# Patient Record
Sex: Female | Born: 2000 | Race: White | Hispanic: No | Marital: Single | Smoking: Never smoker
Health system: Southern US, Community
[De-identification: ages and names within clinical notes are randomized; demographics above are authoritative.]

---
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
## 2020-01-04 ENCOUNTER — Ambulatory Visit: Payer: BC Managed Care – PPO

## 2020-01-16 ENCOUNTER — Ambulatory Visit
Admission: RE | Admit: 2020-01-16 | Discharge: 2020-01-16 | Disposition: A | Discharge status: Home / Self Care | Payer: BC Managed Care – PPO | Source: Ambulatory Visit | Attending: Acute Care | Admitting: Acute Care

## 2020-01-16 ENCOUNTER — Other Ambulatory Visit: Payer: Self-pay

## 2020-01-16 DIAGNOSIS — G35 Multiple sclerosis: Secondary | ICD-10-CM | POA: Diagnosis not present

## 2020-01-16 MED ORDER — GADOBUTROL 1 MMOL/ML IV SOLN
4.0000 mL | Freq: Once | INTRAVENOUS | Status: AC | PRN
  Administered 2020-01-16: 12:00:00 4 mL via INTRAVENOUS

## 2021-01-09 ENCOUNTER — Ambulatory Visit: Payer: BC Managed Care – PPO | Admitting: Dermatology

## 2021-04-05 IMAGING — MR MR HEAD WO/W CM
15 series · 48 of 48 positions shown · IV contrast (gadavist)
Comparison: 08/25/2016

CLINICAL DATA: Headache for 3-4 years which are increasing in
frequency

EXAM:
MRI HEAD WITHOUT AND WITH CONTRAST
TECHNIQUE: Multiplanar, multiecho pulse sequences of the brain and surrounding
structures were obtained without and with intravenous contrast.
CONTRAST:  4mL GADAVIST GADOBUTROL 1 MMOL/ML IV SOLN

[Series 5: ax dwi_tracew · axial · 3.0mm · 0.60mm/px · z∈[-29,+123]mm · 3 of 48 slices shown]
[im 1/48]
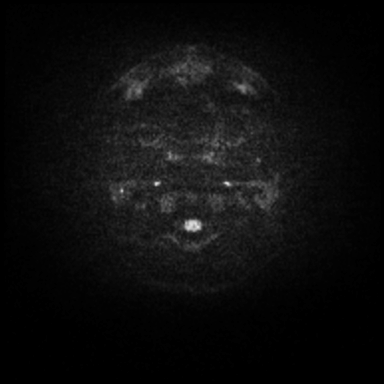
[im 24/48]
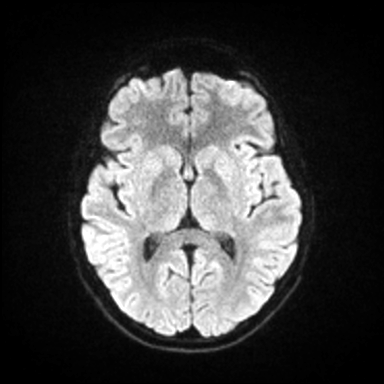
[im 48/48]
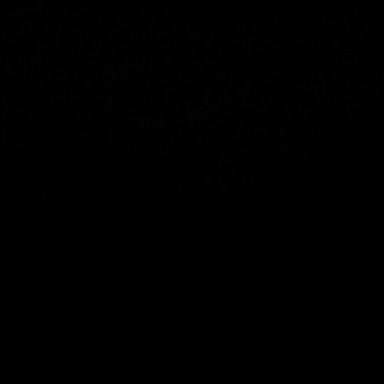

[Series 6: ax dwi_adc · axial · 3.0mm · 0.60mm/px · z∈[-29,+110]mm · 3 of 44 slices shown]
[im 1/44]
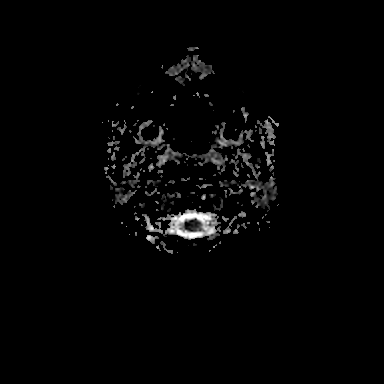
[im 22/44]
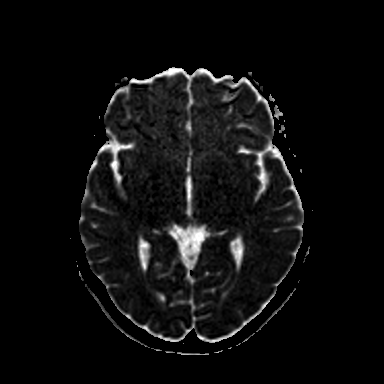
[im 44/44]
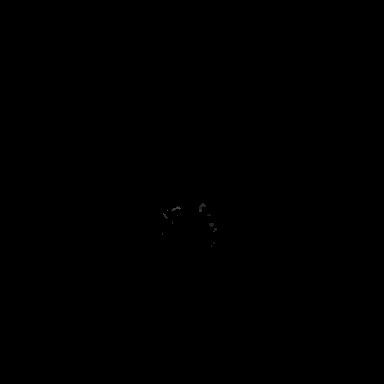

[Series 7: cor dwi_tracew · coronal · 5.0mm · 0.60mm/px · 2 of 38 slices shown]
[im 1/38]
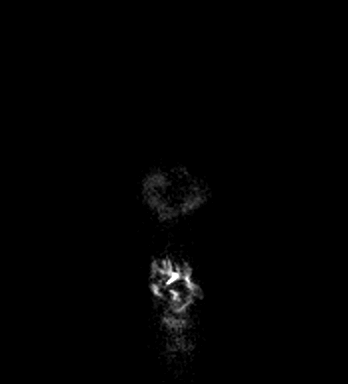
[im 38/38]
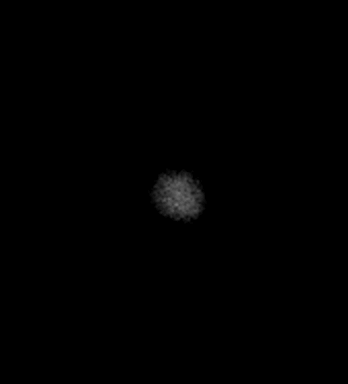

[Series 8: cor dwi_adc · coronal · 5.0mm · 0.60mm/px · 2 of 37 slices shown]
[im 1/37]
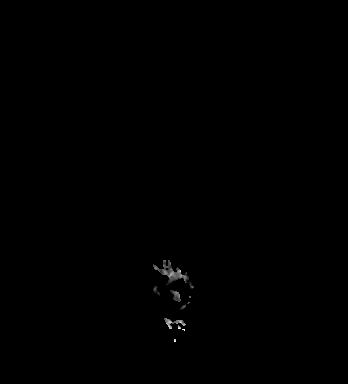
[im 37/37]
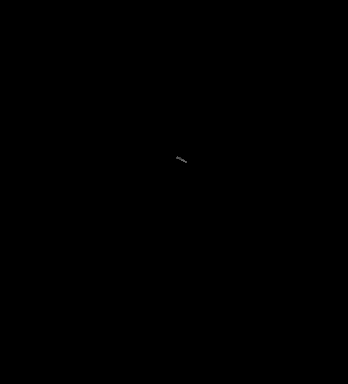

[Series 9: T1 · sagittal · 5.0mm · 0.60mm/px · 1 of 22 slices shown (1 of 2)]
[im 1/22]
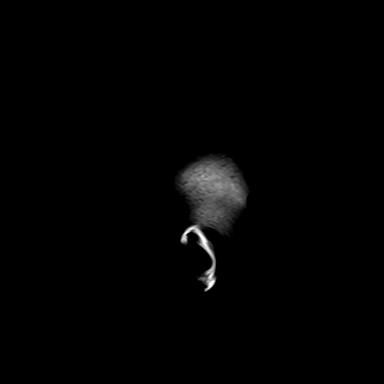

[Series 10: FLAIR · sagittal · 5.0mm · 0.94mm/px · 1 of 23 slices shown (1 of 2)]
[im 1/23]
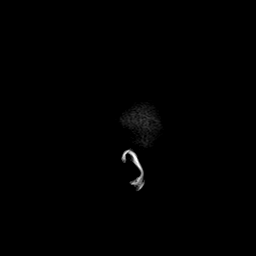

[Series 11: T2 · axial · 5.0mm · 0.55mm/px · 1 of 25 slices shown]
[im 1/25]
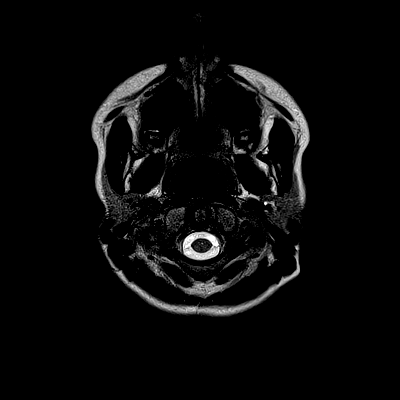

[Series 13: pha_images · axial · 3.0mm · 0.90mm/px · z∈[-38,+125]mm · 3 of 56 slices shown]
[im 1/56]
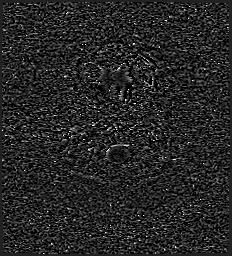
[im 28/56]
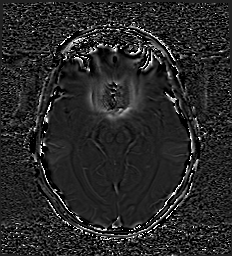
[im 56/56]
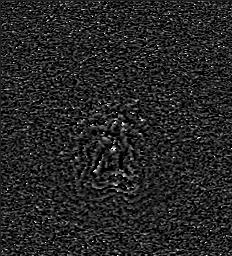

[Series 14: swi_images · axial · 3.0mm · 0.90mm/px · z∈[-38,+137]mm · 4 of 60 slices shown]
[im 1/60]
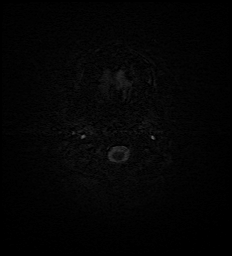
[im 20/60]
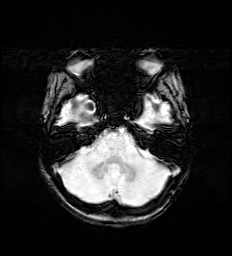
[im 40/60]
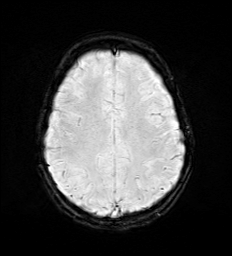
[im 60/60]
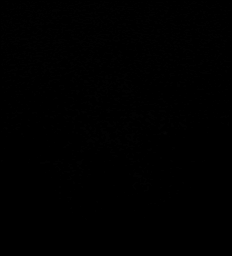

[Series 16: FLAIR · axial · 3.0mm · 0.53mm/px · z∈[-30,+130]mm · 3 of 55 slices shown (2 of 2)]
[im 1/55]
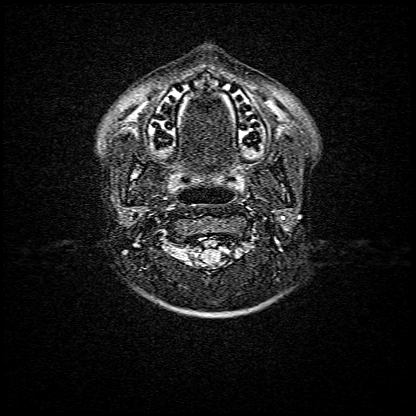
[im 28/55]
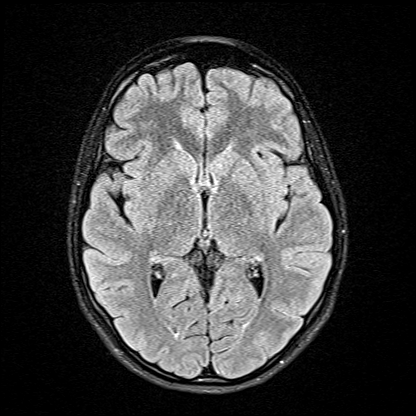
[im 55/55]
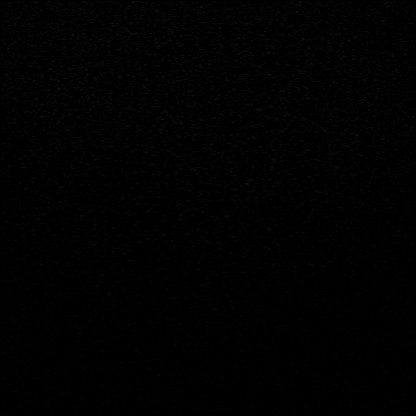

[Series 17: T1 · axial · 1.0mm · 0.98mm/px · z∈[-37,+130]mm · 10 of 162 slices shown (2 of 2)]
[im 1/162]
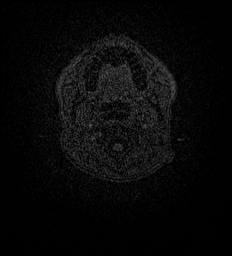
[im 18/162]
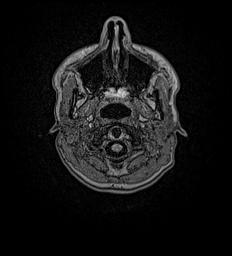
[im 36/162]
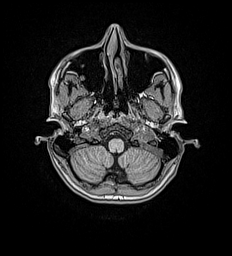
[im 54/162]
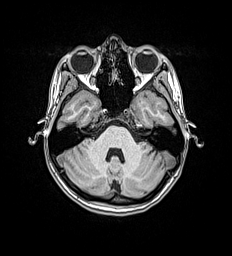
[im 72/162]
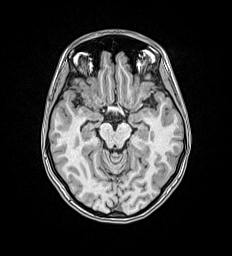
[im 90/162]
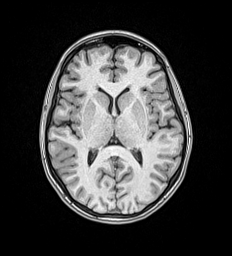
[im 108/162]
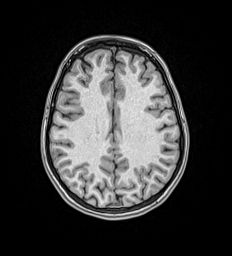
[im 126/162]
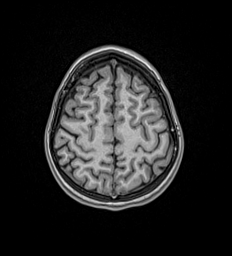
[im 144/162]
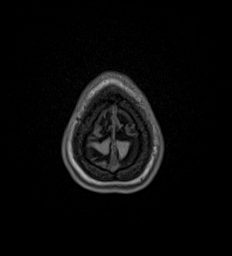
[im 162/162]
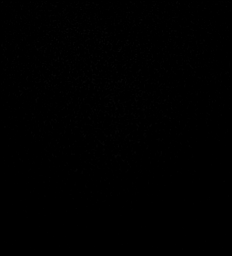

[Series 18: T2 post-contrast · coronal · 5.0mm · 0.57mm/px · 2 of 29 slices shown]
[im 1/29]
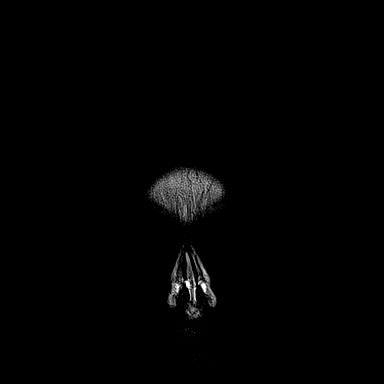
[im 29/29]
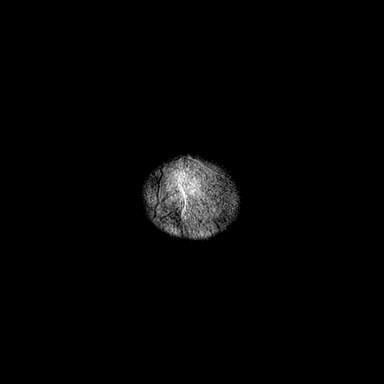

[Series 19: T1 post-contrast · axial · 1.0mm · 0.98mm/px · z∈[-37,+135]mm · 10 of 164 slices shown (1 of 3)]
[im 1/164]
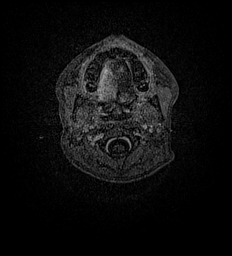
[im 19/164]
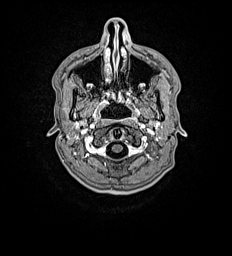
[im 37/164]
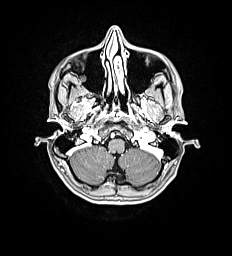
[im 55/164]
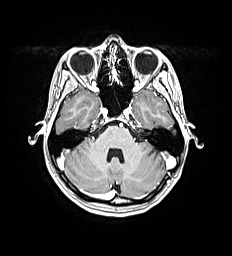
[im 73/164]
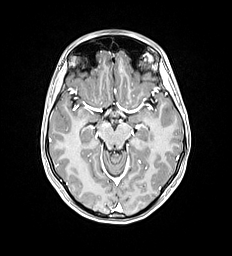
[im 91/164]
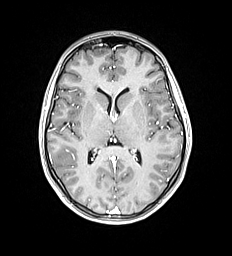
[im 109/164]
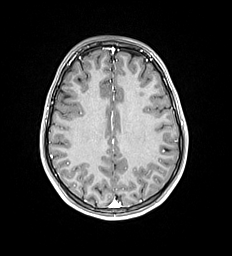
[im 127/164]
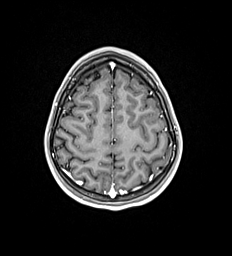
[im 145/164]
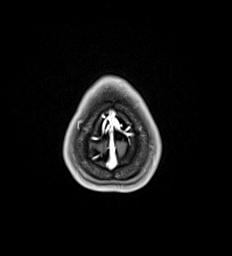
[im 164/164]
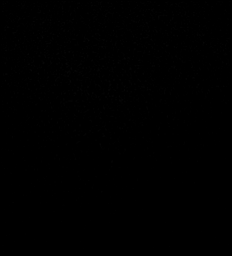

[Series 20: T1 post-contrast · coronal · 5.0mm · 0.57mm/px · 2 of 29 slices shown (2 of 3)]
[im 1/29]
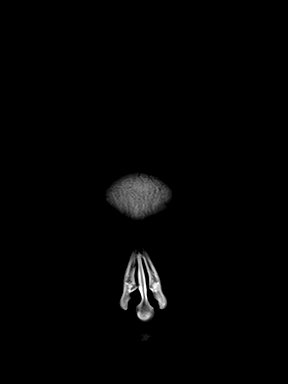
[im 29/29]
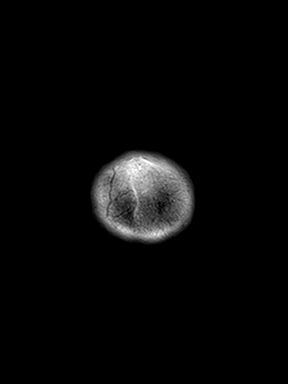

[Series 21: T1 post-contrast · sagittal · 5.0mm · 0.60mm/px · 1 of 22 slices shown (3 of 3)]
[im 1/22]
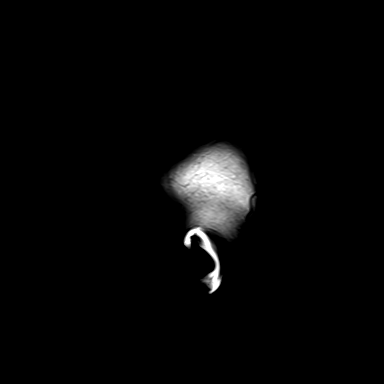

[48 of 48 positions shown; findings below may reference images not displayed]

FINDINGS: Brain: No infarction, hemorrhage, hydrocephalus, extra-axial
collection or mass lesion. No white matter disease or atrophy. No
abnormal intracranial enhancement.

Vascular: Normal flow voids and vessel enhancements

Skull and upper cervical spine: Normal marrow signal.

Sinuses/Orbits: No active sinusitis. There is a small retention cyst
on the roof of the right maxillary sinus.
IMPRESSION: Normal brain MRI
